# Patient Record
Sex: Male | Born: 1975
Health system: Southern US, Community
[De-identification: ages and names within clinical notes are randomized; demographics above are authoritative.]

## PROBLEM LIST (undated history)

## (undated) DIAGNOSIS — E0500 Thyrotoxicosis with diffuse goiter without thyrotoxic crisis or storm: Secondary | ICD-10-CM

## (undated) DIAGNOSIS — E039 Hypothyroidism, unspecified: Secondary | ICD-10-CM

## (undated) HISTORY — PX: TONSILLECTOMY: SUR1361

## (undated) HISTORY — DX: Hypothyroidism, unspecified: E03.9

## (undated) HISTORY — DX: Thyrotoxicosis with diffuse goiter without thyrotoxic crisis or storm: E05.00

---

## 1998-12-03 ENCOUNTER — Emergency Department (HOSPITAL_COMMUNITY): Admission: EM | Admit: 1998-12-03 | Discharge: 1998-12-03 | Payer: Self-pay | Admitting: Emergency Medicine

## 2010-11-14 ENCOUNTER — Other Ambulatory Visit (HOSPITAL_COMMUNITY): Payer: Self-pay | Admitting: Endocrinology

## 2010-11-14 DIAGNOSIS — E0590 Thyrotoxicosis, unspecified without thyrotoxic crisis or storm: Secondary | ICD-10-CM

## 2010-11-22 ENCOUNTER — Ambulatory Visit (HOSPITAL_COMMUNITY): Payer: Self-pay

## 2010-11-22 ENCOUNTER — Encounter (HOSPITAL_COMMUNITY)
Admission: RE | Admit: 2010-11-22 | Discharge: 2010-11-22 | Disposition: A | Discharge status: Home / Self Care | Payer: Managed Care, Other (non HMO) | Source: Ambulatory Visit | Attending: Endocrinology | Admitting: Endocrinology

## 2010-11-22 DIAGNOSIS — E0590 Thyrotoxicosis, unspecified without thyrotoxic crisis or storm: Secondary | ICD-10-CM | POA: Insufficient documentation

## 2010-11-23 ENCOUNTER — Encounter (HOSPITAL_COMMUNITY): Payer: Self-pay

## 2010-11-23 ENCOUNTER — Encounter (HOSPITAL_COMMUNITY)
Admission: RE | Admit: 2010-11-23 | Discharge: 2010-11-23 | Disposition: A | Discharge status: Home / Self Care | Payer: Managed Care, Other (non HMO) | Source: Ambulatory Visit | Attending: Endocrinology | Admitting: Endocrinology

## 2010-11-23 DIAGNOSIS — E0590 Thyrotoxicosis, unspecified without thyrotoxic crisis or storm: Secondary | ICD-10-CM | POA: Insufficient documentation

## 2010-11-23 MED ORDER — SODIUM IODIDE I 131 CAPSULE
7.9000 | Freq: Once | INTRAVENOUS | Status: AC | PRN
  Administered 2010-11-23: 7.9 via ORAL

## 2010-11-27 ENCOUNTER — Other Ambulatory Visit (HOSPITAL_COMMUNITY): Payer: Self-pay | Admitting: Endocrinology

## 2010-11-27 DIAGNOSIS — E0590 Thyrotoxicosis, unspecified without thyrotoxic crisis or storm: Secondary | ICD-10-CM

## 2010-11-28 ENCOUNTER — Encounter (HOSPITAL_COMMUNITY)
Admission: RE | Admit: 2010-11-28 | Discharge: 2010-11-28 | Disposition: A | Discharge status: Home / Self Care | Payer: Managed Care, Other (non HMO) | Source: Ambulatory Visit | Attending: Endocrinology | Admitting: Endocrinology

## 2010-11-28 DIAGNOSIS — E0500 Thyrotoxicosis with diffuse goiter without thyrotoxic crisis or storm: Secondary | ICD-10-CM | POA: Insufficient documentation

## 2010-11-28 DIAGNOSIS — E0590 Thyrotoxicosis, unspecified without thyrotoxic crisis or storm: Secondary | ICD-10-CM

## 2010-11-28 MED ORDER — SODIUM IODIDE I 131 CAPSULE
20.0000 | Freq: Once | INTRAVENOUS | Status: AC | PRN
  Administered 2010-11-28: 21.3 via ORAL

## 2017-07-30 DIAGNOSIS — E039 Hypothyroidism, unspecified: Secondary | ICD-10-CM | POA: Diagnosis not present

## 2017-07-30 DIAGNOSIS — R030 Elevated blood-pressure reading, without diagnosis of hypertension: Secondary | ICD-10-CM | POA: Diagnosis not present

## 2017-07-30 DIAGNOSIS — R635 Abnormal weight gain: Secondary | ICD-10-CM | POA: Diagnosis not present

## 2017-08-31 DIAGNOSIS — J111 Influenza due to unidentified influenza virus with other respiratory manifestations: Secondary | ICD-10-CM | POA: Diagnosis not present

## 2017-08-31 DIAGNOSIS — Z20828 Contact with and (suspected) exposure to other viral communicable diseases: Secondary | ICD-10-CM | POA: Diagnosis not present

## 2018-03-24 DIAGNOSIS — Z23 Encounter for immunization: Secondary | ICD-10-CM | POA: Diagnosis not present

## 2018-03-24 DIAGNOSIS — E039 Hypothyroidism, unspecified: Secondary | ICD-10-CM | POA: Diagnosis not present

## 2018-08-06 DIAGNOSIS — Z720 Tobacco use: Secondary | ICD-10-CM | POA: Diagnosis not present

## 2018-08-06 DIAGNOSIS — E890 Postprocedural hypothyroidism: Secondary | ICD-10-CM | POA: Diagnosis not present

## 2018-08-12 DIAGNOSIS — F17220 Nicotine dependence, chewing tobacco, uncomplicated: Secondary | ICD-10-CM | POA: Diagnosis not present

## 2018-08-12 DIAGNOSIS — W182XXA Fall in (into) shower or empty bathtub, initial encounter: Secondary | ICD-10-CM | POA: Diagnosis not present

## 2018-08-12 DIAGNOSIS — M545 Low back pain: Secondary | ICD-10-CM | POA: Diagnosis not present

## 2018-08-12 DIAGNOSIS — M5430 Sciatica, unspecified side: Secondary | ICD-10-CM | POA: Diagnosis not present

## 2018-08-12 DIAGNOSIS — M79604 Pain in right leg: Secondary | ICD-10-CM | POA: Diagnosis not present

## 2018-08-12 DIAGNOSIS — Z79899 Other long term (current) drug therapy: Secondary | ICD-10-CM | POA: Diagnosis not present

## 2018-08-12 DIAGNOSIS — E079 Disorder of thyroid, unspecified: Secondary | ICD-10-CM | POA: Diagnosis not present

## 2018-08-12 DIAGNOSIS — M5441 Lumbago with sciatica, right side: Secondary | ICD-10-CM | POA: Diagnosis not present

## 2018-08-14 DIAGNOSIS — M545 Low back pain: Secondary | ICD-10-CM | POA: Diagnosis not present

## 2018-08-14 DIAGNOSIS — W19XXXD Unspecified fall, subsequent encounter: Secondary | ICD-10-CM | POA: Diagnosis not present

## 2018-08-14 DIAGNOSIS — S3992XD Unspecified injury of lower back, subsequent encounter: Secondary | ICD-10-CM | POA: Diagnosis not present

## 2018-08-20 DIAGNOSIS — M7918 Myalgia, other site: Secondary | ICD-10-CM | POA: Diagnosis not present

## 2018-08-20 DIAGNOSIS — M47816 Spondylosis without myelopathy or radiculopathy, lumbar region: Secondary | ICD-10-CM | POA: Diagnosis not present

## 2019-01-08 DIAGNOSIS — Z13220 Encounter for screening for lipoid disorders: Secondary | ICD-10-CM | POA: Diagnosis not present

## 2019-01-08 DIAGNOSIS — Z131 Encounter for screening for diabetes mellitus: Secondary | ICD-10-CM | POA: Diagnosis not present

## 2019-01-08 DIAGNOSIS — E890 Postprocedural hypothyroidism: Secondary | ICD-10-CM | POA: Diagnosis not present

## 2019-01-08 DIAGNOSIS — Z125 Encounter for screening for malignant neoplasm of prostate: Secondary | ICD-10-CM | POA: Diagnosis not present

## 2019-01-08 DIAGNOSIS — Z87891 Personal history of nicotine dependence: Secondary | ICD-10-CM | POA: Diagnosis not present

## 2019-01-08 DIAGNOSIS — Z0000 Encounter for general adult medical examination without abnormal findings: Secondary | ICD-10-CM | POA: Diagnosis not present

## 2019-01-20 ENCOUNTER — Other Ambulatory Visit: Payer: Self-pay | Admitting: Physician Assistant

## 2019-01-20 DIAGNOSIS — M25519 Pain in unspecified shoulder: Secondary | ICD-10-CM

## 2019-02-04 ENCOUNTER — Ambulatory Visit
Admission: RE | Admit: 2019-02-04 | Discharge: 2019-02-04 | Disposition: A | Discharge status: Home / Self Care | Payer: Managed Care, Other (non HMO) | Source: Ambulatory Visit | Attending: Physician Assistant | Admitting: Physician Assistant

## 2019-02-04 ENCOUNTER — Other Ambulatory Visit: Payer: Self-pay | Admitting: Internal Medicine

## 2019-02-04 DIAGNOSIS — M25519 Pain in unspecified shoulder: Secondary | ICD-10-CM

## 2019-02-04 DIAGNOSIS — M67813 Other specified disorders of tendon, right shoulder: Secondary | ICD-10-CM | POA: Diagnosis not present

## 2019-06-03 DIAGNOSIS — Z20828 Contact with and (suspected) exposure to other viral communicable diseases: Secondary | ICD-10-CM | POA: Diagnosis not present

## 2019-10-02 DIAGNOSIS — E890 Postprocedural hypothyroidism: Secondary | ICD-10-CM | POA: Diagnosis not present

## 2020-02-02 DIAGNOSIS — E890 Postprocedural hypothyroidism: Secondary | ICD-10-CM | POA: Diagnosis not present

## 2020-05-09 DIAGNOSIS — Z8042 Family history of malignant neoplasm of prostate: Secondary | ICD-10-CM | POA: Diagnosis not present

## 2020-05-09 DIAGNOSIS — R319 Hematuria, unspecified: Secondary | ICD-10-CM | POA: Diagnosis not present

## 2020-06-08 DIAGNOSIS — U071 COVID-19: Secondary | ICD-10-CM

## 2020-06-08 DIAGNOSIS — J1282 Pneumonia due to coronavirus disease 2019: Secondary | ICD-10-CM

## 2020-06-08 HISTORY — DX: Pneumonia due to coronavirus disease 2019: J12.82

## 2020-06-08 HISTORY — DX: COVID-19: U07.1

## 2020-08-05 DIAGNOSIS — U071 COVID-19: Secondary | ICD-10-CM | POA: Diagnosis not present

## 2020-08-05 DIAGNOSIS — J40 Bronchitis, not specified as acute or chronic: Secondary | ICD-10-CM | POA: Diagnosis not present

## 2020-08-29 DIAGNOSIS — J189 Pneumonia, unspecified organism: Secondary | ICD-10-CM | POA: Diagnosis not present

## 2020-09-26 DIAGNOSIS — B948 Sequelae of other specified infectious and parasitic diseases: Secondary | ICD-10-CM | POA: Diagnosis not present

## 2020-09-26 DIAGNOSIS — J069 Acute upper respiratory infection, unspecified: Secondary | ICD-10-CM | POA: Diagnosis not present

## 2020-11-08 ENCOUNTER — Ambulatory Visit: Payer: BC Managed Care – PPO | Admitting: Pulmonary Disease

## 2020-11-08 ENCOUNTER — Ambulatory Visit (INDEPENDENT_AMBULATORY_CARE_PROVIDER_SITE_OTHER): Payer: BC Managed Care – PPO

## 2020-11-08 ENCOUNTER — Other Ambulatory Visit: Payer: Self-pay

## 2020-11-08 ENCOUNTER — Encounter: Payer: Self-pay | Admitting: Pulmonary Disease

## 2020-11-08 VITALS — BP 110/70 | HR 99 | Temp 98.2°F | Ht 72.0 in | Wt 182.8 lb

## 2020-11-08 DIAGNOSIS — R053 Chronic cough: Secondary | ICD-10-CM

## 2020-11-08 DIAGNOSIS — R059 Cough, unspecified: Secondary | ICD-10-CM | POA: Diagnosis not present

## 2020-11-08 LAB — COMPREHENSIVE METABOLIC PANEL
ALT: 14 U/L (ref 0–53)
AST: 20 U/L (ref 0–37)
Albumin: 4.6 g/dL (ref 3.5–5.2)
Alkaline Phosphatase: 80 U/L (ref 39–117)
BUN: 21 mg/dL (ref 6–23)
CO2: 28 mEq/L (ref 19–32)
Calcium: 10.1 mg/dL (ref 8.4–10.5)
Chloride: 104 mEq/L (ref 96–112)
Creatinine, Ser: 1.15 mg/dL (ref 0.40–1.50)
GFR: 76.17 mL/min (ref >60.00)
Glucose, Bld: 74 mg/dL (ref 70–99)
Potassium: 4 mEq/L (ref 3.5–5.1)
Sodium: 139 mEq/L (ref 135–145)
Total Bilirubin: 0.9 mg/dL (ref 0.2–1.2)
Total Protein: 7.2 g/dL (ref 6.0–8.3)

## 2020-11-08 LAB — CBC WITH DIFFERENTIAL/PLATELET
Basophils Absolute: 0.1 10*3/uL (ref 0.0–0.1)
Basophils Relative: 2.5 % (ref 0.0–3.0)
Eosinophils Absolute: 1 10*3/uL — ABNORMAL HIGH (ref 0.0–0.7)
Eosinophils Relative: 17.8 % — ABNORMAL HIGH (ref 0.0–5.0)
HCT: 43.3 % (ref 39.0–52.0)
Hemoglobin: 14.4 g/dL (ref 13.0–17.0)
Lymphocytes Relative: 37.6 % (ref 12.0–46.0)
Lymphs Abs: 2.2 10*3/uL (ref 0.7–4.0)
MCHC: 33.3 g/dL (ref 30.0–36.0)
MCV: 93.4 fl (ref 78.0–100.0)
Monocytes Absolute: 0.6 10*3/uL (ref 0.1–1.0)
Monocytes Relative: 10.7 % (ref 3.0–12.0)
Neutro Abs: 1.8 10*3/uL (ref 1.4–7.7)
Neutrophils Relative %: 31.4 % — ABNORMAL LOW (ref 43.0–77.0)
Platelets: 316 10*3/uL (ref 150.0–400.0)
RBC: 4.63 Mil/uL (ref 4.22–5.81)
RDW: 13.1 % (ref 11.5–15.5)
WBC: 5.8 10*3/uL (ref 4.0–10.5)

## 2020-11-08 MED ORDER — FLOVENT HFA 110 MCG/ACT IN AERO: 2.0000 | INHALATION_SPRAY | Freq: Two times a day (BID) | RESPIRATORY_TRACT | 12 refills | Status: AC

## 2020-11-08 NOTE — Patient Instructions (Signed)
Chest xray and lab tests today  Flovent two puffs in the morning and two puffs in the evening, and rinse your mouth after each use  Albuterol two puffs every 6 hours as needed for cough, wheeze, or chest congestion  Will schedule pulmonary function test   Follow up in 4 weeks with Dr. Craige Cotta or Nurse Practitioner

## 2020-11-08 NOTE — Progress Notes (Signed)
[facility]  Chief Complaint  Patient presents with  . Consult    Non productive cough, wheezing at times since having covid back in December 2021    Constitutional:  BP 110/70 (BP Location: Right Arm, Cuff Size: Normal)   Pulse 99   Temp 98.2 F (36.8 C) (Temporal)   Ht 6' (1.829 m)   Wt 182 lb 12.8 oz (82.9 kg)   SpO2 98% Comment: Room air  BMI 24.79 kg/m   Past Medical History:  Hypothyroidism, COVID 19 pneumonia December 2021  Past Surgical History:  He  has a past surgical history that includes Tonsillectomy.  Brief Summary:  Lawrence Koch is a 45 y.o. male former smoker with chronic cough after having COVID 19 pneumonia in December 2021.       Subjective:   He didn't have any respiratory issues prior to getting COVID 19 pneumonia in December 2021.  He didn't seek an specific treatment then.  He was having pain in his chest and his right hip.  He developed a persist cough.  He had more chest pain in February and went to urgent care.  He was told he had double pneumonia.  He was treated with antibiotics, prednisone, cough medicine and albuterol.  These therapies helped some.  He still uses albuterol.  He has not had repeat xray since February.  He hears himself wheezing at times.  His cough is usually dry.  He get right hip pain when standing, walking, or coughing.  He had a rash on his face a couple of weeks ago that went away after he used benadryl.  No prior history of asthma or allergies.  He quit smoking several years ago.  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  He has pet dog.  He does snore at night.    Physical Exam:   Appearance - well kempt   ENMT - no sinus tenderness, no oral exudate, no LAN, Mallampati 4 airway, no stridor, narrow jaw, retrognathic  Respiratory - equal breath sounds bilaterally, faint wheezing in Rt upper chest anteriorly  CV - s1s2 regular rate and rhythm, no  murmurs  Ext - no clubbing, no edema  Skin - no rashes  Psych - normal mood and affect   Pulmonary testing:    Chest Imaging:    Sleep Tests:    Social History:  He  reports that he quit smoking about a year ago. His smoking use included cigarettes. He has a 25.00 pack-year smoking history. His smokeless tobacco use includes chew.  Family History:  His family history includes COPD in his paternal grandfather; Hypothyroidism in his mother; Prostate cancer in his father.    Discussion:  He has persistent cough since having COVID 19 pneumonia and then community acquired pneumonia.  I am concerned he could now have asthma.  It is also possible he could have developed interstitial lung disease related to prior COVID infection.  He also has some snoring and might have sleep apnea.  Assessment/Plan:   Chronic cough with concern for asthma or ILD. - will have him try flovent 100 mcg two puffs bid and continue albuterol prn - will arrange for CMET, CBC with diff, Chest Xray, and PFT  Snoring. - might need assessment for sleep apnea; reassess at next visit  Right hip pain. - advised him to follow up with his PCP  Time Spent Involved in Patient Care on Day of Examination:  19  minutes  Follow up:  Patient Instructions  Chest xray and lab tests today  Flovent two puffs in the morning and two puffs in the evening, and rinse your mouth after each use  Albuterol two puffs every 6 hours as needed for cough, wheeze, or chest congestion  Will schedule pulmonary function test   Follow up in 4 weeks with Dr. Craige Cotta or Nurse Practitioner   Medication List:   Allergies as of 11/08/2020   No Known Allergies     Medication List       Accurate as of Nov 08, 2020 11:27 AM. If you have any questions, ask your nurse or doctor.        albuterol 108 (90 Base) MCG/ACT inhaler Commonly known as: VENTOLIN HFA 1-2 puffs every 6 (six) hours as needed.   cyclobenzaprine 10 MG  tablet Commonly known as: FLEXERIL Take 1 tablet by mouth 3 (three) times daily as needed.   Flovent HFA 110 MCG/ACT inhaler Generic drug: fluticasone Inhale 2 puffs into the lungs in the morning and at bedtime. Started by: Coralyn Helling, MD   levothyroxine 137 MCG tablet Commonly known as: SYNTHROID Take 137 mcg by mouth every morning.       Signature:  Coralyn Helling, MD [facility] Pulmonary/Critical Care Pager - [phone] 11/08/2020, 11:27 AM

## 2020-11-09 NOTE — Progress Notes (Signed)
Called and went over lab result per Dr Craige Cotta with patient. All questions answered and patient expressed full understanding of results and Dr Evlyn Courier recommendations. Nothing further needed at this time.

## 2020-11-14 ENCOUNTER — Encounter: Payer: Self-pay | Admitting: *Deleted

## 2020-12-06 ENCOUNTER — Other Ambulatory Visit (HOSPITAL_COMMUNITY)
Admission: RE | Admit: 2020-12-06 | Discharge: 2020-12-06 | Disposition: A | Discharge status: Home / Self Care | Payer: BC Managed Care – PPO | Source: Ambulatory Visit | Attending: Pulmonary Disease | Admitting: Pulmonary Disease

## 2020-12-06 DIAGNOSIS — Z01812 Encounter for preprocedural laboratory examination: Secondary | ICD-10-CM | POA: Diagnosis not present

## 2020-12-06 DIAGNOSIS — Z20822 Contact with and (suspected) exposure to covid-19: Secondary | ICD-10-CM | POA: Insufficient documentation

## 2020-12-06 LAB — SARS CORONAVIRUS 2 (TAT 6-24 HRS): SARS Coronavirus 2: NEGATIVE

## 2020-12-09 ENCOUNTER — Ambulatory Visit: Payer: BC Managed Care – PPO | Admitting: Pulmonary Disease

## 2021-01-16 ENCOUNTER — Other Ambulatory Visit (HOSPITAL_COMMUNITY): Payer: BC Managed Care – PPO

## 2021-01-16 DIAGNOSIS — M545 Low back pain, unspecified: Secondary | ICD-10-CM | POA: Diagnosis not present

## 2021-01-18 ENCOUNTER — Ambulatory Visit: Payer: BC Managed Care – PPO | Admitting: Pulmonary Disease

## 2021-01-20 ENCOUNTER — Emergency Department (HOSPITAL_COMMUNITY)
Admission: EM | Admit: 2021-01-20 | Discharge: 2021-01-20 | Disposition: A | Discharge status: Home / Self Care | Payer: BC Managed Care – PPO | Attending: Emergency Medicine | Admitting: Emergency Medicine

## 2021-01-20 ENCOUNTER — Other Ambulatory Visit: Payer: Self-pay

## 2021-01-20 ENCOUNTER — Encounter (HOSPITAL_COMMUNITY): Payer: Self-pay | Admitting: Emergency Medicine

## 2021-01-20 ENCOUNTER — Emergency Department (HOSPITAL_COMMUNITY): Payer: BC Managed Care – PPO

## 2021-01-20 DIAGNOSIS — M545 Low back pain, unspecified: Secondary | ICD-10-CM | POA: Insufficient documentation

## 2021-01-20 DIAGNOSIS — M79604 Pain in right leg: Secondary | ICD-10-CM | POA: Insufficient documentation

## 2021-01-20 DIAGNOSIS — Z79899 Other long term (current) drug therapy: Secondary | ICD-10-CM | POA: Diagnosis not present

## 2021-01-20 DIAGNOSIS — M25551 Pain in right hip: Secondary | ICD-10-CM | POA: Diagnosis not present

## 2021-01-20 DIAGNOSIS — R531 Weakness: Secondary | ICD-10-CM | POA: Diagnosis not present

## 2021-01-20 DIAGNOSIS — E039 Hypothyroidism, unspecified: Secondary | ICD-10-CM | POA: Diagnosis not present

## 2021-01-20 DIAGNOSIS — M5440 Lumbago with sciatica, unspecified side: Secondary | ICD-10-CM

## 2021-01-20 DIAGNOSIS — F17220 Nicotine dependence, chewing tobacco, uncomplicated: Secondary | ICD-10-CM | POA: Diagnosis not present

## 2021-01-20 DIAGNOSIS — Z8616 Personal history of COVID-19: Secondary | ICD-10-CM | POA: Insufficient documentation

## 2021-01-20 DIAGNOSIS — R200 Anesthesia of skin: Secondary | ICD-10-CM | POA: Insufficient documentation

## 2021-01-20 LAB — BASIC METABOLIC PANEL
Anion gap: 7 (ref 5–15)
BUN: 23 mg/dL — ABNORMAL HIGH (ref 6–20)
CO2: 32 mmol/L (ref 22–32)
Calcium: 10 mg/dL (ref 8.9–10.3)
Chloride: 100 mmol/L (ref 98–111)
Creatinine, Ser: 1.05 mg/dL (ref 0.61–1.24)
GFR, Estimated: >60 mL/min (ref >60)
Glucose, Bld: 72 mg/dL (ref 70–99)
Potassium: 3.2 mmol/L — ABNORMAL LOW (ref 3.5–5.1)
Sodium: 139 mmol/L (ref 135–145)

## 2021-01-20 LAB — CBC WITH DIFFERENTIAL/PLATELET
Abs Immature Granulocytes: 0.03 10*3/uL (ref 0.00–0.07)
Basophils Absolute: 0.1 10*3/uL (ref 0.0–0.1)
Basophils Relative: 1 %
Eosinophils Absolute: 0.1 10*3/uL (ref 0.0–0.5)
Eosinophils Relative: 1 %
HCT: 46 % (ref 39.0–52.0)
Hemoglobin: 15.4 g/dL (ref 13.0–17.0)
Immature Granulocytes: 0 %
Lymphocytes Relative: 27 %
Lymphs Abs: 2.6 10*3/uL (ref 0.7–4.0)
MCH: 31 pg (ref 26.0–34.0)
MCHC: 33.5 g/dL (ref 30.0–36.0)
MCV: 92.7 fL (ref 80.0–100.0)
Monocytes Absolute: 1 10*3/uL (ref 0.1–1.0)
Monocytes Relative: 10 %
Neutro Abs: 6 10*3/uL (ref 1.7–7.7)
Neutrophils Relative %: 61 %
Platelets: 324 10*3/uL (ref 150–400)
RBC: 4.96 MIL/uL (ref 4.22–5.81)
RDW: 12 % (ref 11.5–15.5)
WBC: 9.8 10*3/uL (ref 4.0–10.5)
nRBC: 0 % (ref 0.0–0.2)

## 2021-01-20 MED ORDER — NAPROXEN 500 MG PO TABS: 500.0000 mg | ORAL_TABLET | Freq: Two times a day (BID) | ORAL | 0 refills | Status: AC

## 2021-01-20 MED ORDER — LORAZEPAM 1 MG PO TABS
0.5000 mg | ORAL_TABLET | Freq: Once | ORAL | Status: AC
  Administered 2021-01-20: 0.5 mg via ORAL
  Filled 2021-01-20: qty 1

## 2021-01-20 MED ORDER — SODIUM CHLORIDE 0.9 % IV BOLUS
500.0000 mL | Freq: Once | INTRAVENOUS | Status: AC
  Administered 2021-01-20: 500 mL via INTRAVENOUS

## 2021-01-20 MED ORDER — MORPHINE SULFATE (PF) 4 MG/ML IV SOLN
4.0000 mg | Freq: Once | INTRAVENOUS | Status: AC
  Administered 2021-01-20: 4 mg via INTRAVENOUS
  Filled 2021-01-20: qty 1

## 2021-01-20 MED ORDER — GADOBUTROL 1 MMOL/ML IV SOLN
8.0000 mL | Freq: Once | INTRAVENOUS | Status: AC | PRN
  Administered 2021-01-20: 8 mL via INTRAVENOUS

## 2021-01-20 NOTE — Discharge Instructions (Addendum)
Call your primary care doctor or specialist as discussed in the next 2-3 days.   Return immediately back to the ER if:  Your symptoms worsen within the next 12-24 hours. You develop new symptoms such as new fevers, persistent vomiting, new pain, shortness of breath, or new weakness or numbness, or if you have any other concerns.  

## 2021-01-20 NOTE — ED Provider Notes (Signed)
[facility] EMERGENCY DEPARTMENT Provider Note   CSN: [id number] Arrival date & time: 01/20/21  1016     History Chief Complaint  Patient presents with   Back Pain    Lawrence Koch is a 45 y.o. male.  HPI  45 year old male presents the emergency department with concern for lower back pain.  Patient states this started about a week ago.  Primarily was in the lower back, now he is having shooting pain from his right hip down his right leg with numbness of the lateral leg wrapping into his groin.  States that the pain gets worse with weightbearing and ambulating.  He was seen as an outpatient and treated for sciatica with pain meds, muscle relaxer and steroids without any relief.  Patient was evaluated by Ortho earlier today and there was mention of an abnormal finding on L5 on the x-ray with concern for possible cauda equina syndrome so he was referred to the ER for further evaluation.  Patient continues to pain of the shooting leg down the right side with numbness primarily in the lower leg and groin area, no foot drop.  No leg discoloration, no back injury or trauma.  Patient feels as if his urine has been darker than normal with some slight hesitation but he denies any retention or incontinence.  Past Medical History:  Diagnosis Date   Graves disease    Hypothyroidism    Pneumonia due to COVID-19 virus 06/2020    There are no problems to display for this patient.   Past Surgical History:  Procedure Laterality Date   TONSILLECTOMY         Family History  Problem Relation Age of Onset   COPD Paternal Grandfather    Hypothyroidism Mother    Prostate cancer Father     Social History   Tobacco Use   Smoking status: Former    Packs/day: 1.00    Years: 25.00    Pack years: 25.00    Types: Cigarettes    Quit date: 11/2019    Years since quitting: 1.2   Smokeless tobacco: Current    Types: Chew  Vaping Use   Vaping Use: Never used    Home  Medications Prior to Admission medications   Medication Sig Start Date End Date Taking? Authorizing Provider  albuterol (VENTOLIN HFA) 108 (90 Base) MCG/ACT inhaler 1-2 puffs every 6 (six) hours as needed. 08/29/20   [provider]  cyclobenzaprine (FLEXERIL) 10 MG tablet Take 1 tablet by mouth 3 (three) times daily as needed. 09/30/20   [provider]  fluticasone (FLOVENT HFA) 110 MCG/ACT inhaler Inhale 2 puffs into the lungs in the morning and at bedtime. 11/08/20   Coralyn Helling, MD  levothyroxine (SYNTHROID) 137 MCG tablet Take 137 mcg by mouth every morning. 09/01/20   [provider]    Allergies    Patient has no known allergies.  Review of Systems   Review of Systems  Constitutional:  Negative for chills and fever.  HENT:  Negative for congestion.   Eyes:  Negative for visual disturbance.  Respiratory:  Negative for shortness of breath.   Cardiovascular:  Negative for chest pain.  Gastrointestinal:  Negative for abdominal pain, diarrhea and vomiting.  Genitourinary:  Negative for dysuria.       Dark urine with hesitation  Musculoskeletal:  Positive for back pain.       + Right lower extremity pain with numbness  Skin:  Negative for rash.  Neurological:  Negative for headaches.   Physical Exam Updated Vital Signs BP (!) 161/104   Pulse 79   Temp 98.1 F (36.7 C)   Resp 16   Ht 6' (1.829 m)   Wt 83.9 kg   SpO2 98%   BMI 25.09 kg/m   Physical Exam Vitals and nursing note reviewed.  Constitutional:      Appearance: Normal appearance.  HENT:     Head: Normocephalic.     Mouth/Throat:     Mouth: Mucous membranes are moist.  Cardiovascular:     Rate and Rhythm: Normal rate.  Pulmonary:     Effort: Pulmonary effort is normal. No respiratory distress.  Abdominal:     Palpations: Abdomen is soft.     Tenderness: There is no abdominal tenderness.  Genitourinary:    Comments: Rectal tone exam deferred at this time due to the patient being in  the hallway Musculoskeletal:        General: No deformity.     Cervical back: No tenderness.     Comments: Mild weakness of the right lower extremity with hip flexion and knee extension which appears to be secondary to pain, equal plantar flexion, palpable DP pulses, objective numbness  Skin:    General: Skin is warm.  Neurological:     Mental Status: He is alert and oriented to person, place, and time. Mental status is at baseline.  Psychiatric:        Mood and Affect: Mood normal.    ED Results / Procedures / Treatments   Labs (all labs ordered are listed, but only abnormal results are displayed) Labs Reviewed  URINALYSIS, ROUTINE W REFLEX MICROSCOPIC  CBC WITH DIFFERENTIAL/PLATELET  BASIC METABOLIC PANEL    EKG None  Radiology No results found.  Procedures Procedures   Medications Ordered in ED Medications  morphine 4 MG/ML injection 4 mg (4 mg Intravenous Given 01/20/21 1223)  sodium chloride 0.9 % bolus 500 mL (500 mLs Intravenous New Bag/Given 01/20/21 1223)  LORazepam (ATIVAN) tablet 0.5 mg (0.5 mg Oral Given 01/20/21 1229)    ED Course  I have reviewed the triage vital signs and the nursing notes.  Pertinent labs & imaging results that were available during my care of the patient were reviewed by me and considered in my medical decision making (see chart for details).    MDM Rules/Calculators/A&P                          45 year old male presents the emergency department with lower back and right leg pain, weakness and numbness that extends into his groin.  Has been treated as an outpatient for sciatica without any relief, was seen by orthopedics and referred here for rule out cauda equina with an MRI.  Patient does have weakness on exam, possibly secondary to pain with subjective numbness on the lateral right leg and groin.  Plan for MRI for rule out cauda equina. Patient signed out to oncoming provider pending MRI and re evaluation.  Final Clinical  Impression(s) / ED Diagnoses Final diagnoses:  None    Rx / DC Orders ED Discharge Orders     None        Rozelle Logan, DO 01/20/21 1503

## 2021-01-20 NOTE — ED Notes (Signed)
Patient transported to MRI 

## 2021-01-20 NOTE — ED Provider Notes (Signed)
Shows no evidence of cauda equina.  Mild to moderate disc protrusion noted at L5-S1 and L4-L5.  Case discussed orthopedics.  Recommending outpatient follow-up with his doctors within the week.  Recommending return for worsening symptoms or any additional concerns.    Cheryll Cockayne, MD 01/20/21 [phone]

## 2021-01-20 NOTE — ED Triage Notes (Signed)
Pt reports lower back pain that started Sunday, went to doctor on Monday, was diagnosed with sciatica and reports pain has not gotten better after prescription medications. Pt reports shooting pain from rt hip down rt leg with numbness.  Pt reports pain gets worse with bearing weight and ambulating, relief with laying on left hip. Denies any recent injuries. Pt reports oliguria, dark urine, denies any blood. Pt reports he had kidney stones in the past but reports this feels different.

## 2021-01-31 DIAGNOSIS — M5416 Radiculopathy, lumbar region: Secondary | ICD-10-CM | POA: Diagnosis not present

## 2021-02-22 ENCOUNTER — Ambulatory Visit: Payer: BC Managed Care – PPO | Admitting: Pulmonary Disease

## 2021-02-22 DIAGNOSIS — M5416 Radiculopathy, lumbar region: Secondary | ICD-10-CM | POA: Diagnosis not present

## 2021-02-28 DIAGNOSIS — M5416 Radiculopathy, lumbar region: Secondary | ICD-10-CM | POA: Diagnosis not present

## 2021-03-15 DIAGNOSIS — M5416 Radiculopathy, lumbar region: Secondary | ICD-10-CM | POA: Diagnosis not present

## 2021-03-20 DIAGNOSIS — M5416 Radiculopathy, lumbar region: Secondary | ICD-10-CM | POA: Diagnosis not present

## 2021-03-29 DIAGNOSIS — R269 Unspecified abnormalities of gait and mobility: Secondary | ICD-10-CM | POA: Diagnosis not present

## 2021-03-29 DIAGNOSIS — R531 Weakness: Secondary | ICD-10-CM | POA: Diagnosis not present

## 2021-03-29 DIAGNOSIS — M5416 Radiculopathy, lumbar region: Secondary | ICD-10-CM | POA: Diagnosis not present

## 2021-04-14 DIAGNOSIS — R531 Weakness: Secondary | ICD-10-CM | POA: Diagnosis not present

## 2021-04-14 DIAGNOSIS — R269 Unspecified abnormalities of gait and mobility: Secondary | ICD-10-CM | POA: Diagnosis not present

## 2021-04-14 DIAGNOSIS — M5416 Radiculopathy, lumbar region: Secondary | ICD-10-CM | POA: Diagnosis not present

## 2021-04-18 ENCOUNTER — Ambulatory Visit: Payer: BC Managed Care – PPO | Admitting: Pulmonary Disease

## 2021-04-21 DIAGNOSIS — R531 Weakness: Secondary | ICD-10-CM | POA: Diagnosis not present

## 2021-04-21 DIAGNOSIS — R269 Unspecified abnormalities of gait and mobility: Secondary | ICD-10-CM | POA: Diagnosis not present

## 2021-04-21 DIAGNOSIS — M5416 Radiculopathy, lumbar region: Secondary | ICD-10-CM | POA: Diagnosis not present

## 2021-05-05 DIAGNOSIS — R269 Unspecified abnormalities of gait and mobility: Secondary | ICD-10-CM | POA: Diagnosis not present

## 2021-05-05 DIAGNOSIS — M5416 Radiculopathy, lumbar region: Secondary | ICD-10-CM | POA: Diagnosis not present

## 2021-05-05 DIAGNOSIS — R531 Weakness: Secondary | ICD-10-CM | POA: Diagnosis not present

## 2021-09-19 DIAGNOSIS — F439 Reaction to severe stress, unspecified: Secondary | ICD-10-CM | POA: Diagnosis not present

## 2021-09-19 DIAGNOSIS — B948 Sequelae of other specified infectious and parasitic diseases: Secondary | ICD-10-CM | POA: Diagnosis not present

## 2021-09-19 DIAGNOSIS — Z0000 Encounter for general adult medical examination without abnormal findings: Secondary | ICD-10-CM | POA: Diagnosis not present

## 2021-09-19 DIAGNOSIS — Z13220 Encounter for screening for lipoid disorders: Secondary | ICD-10-CM | POA: Diagnosis not present

## 2021-09-19 DIAGNOSIS — Z125 Encounter for screening for malignant neoplasm of prostate: Secondary | ICD-10-CM | POA: Diagnosis not present

## 2021-09-19 DIAGNOSIS — Z23 Encounter for immunization: Secondary | ICD-10-CM | POA: Diagnosis not present

## 2021-09-19 DIAGNOSIS — E890 Postprocedural hypothyroidism: Secondary | ICD-10-CM | POA: Diagnosis not present

## 2021-11-20 DIAGNOSIS — E782 Mixed hyperlipidemia: Secondary | ICD-10-CM | POA: Diagnosis not present

## 2021-11-20 DIAGNOSIS — E890 Postprocedural hypothyroidism: Secondary | ICD-10-CM | POA: Diagnosis not present

## 2021-11-20 DIAGNOSIS — F439 Reaction to severe stress, unspecified: Secondary | ICD-10-CM | POA: Diagnosis not present

## 2022-05-24 DIAGNOSIS — E782 Mixed hyperlipidemia: Secondary | ICD-10-CM | POA: Diagnosis not present

## 2022-05-24 DIAGNOSIS — E890 Postprocedural hypothyroidism: Secondary | ICD-10-CM | POA: Diagnosis not present

## 2022-05-24 DIAGNOSIS — F439 Reaction to severe stress, unspecified: Secondary | ICD-10-CM | POA: Diagnosis not present

## 2022-06-14 DIAGNOSIS — J069 Acute upper respiratory infection, unspecified: Secondary | ICD-10-CM | POA: Diagnosis not present

## 2023-06-03 IMAGING — MR MR LUMBAR SPINE WO/W CM
4 of 7 series · 24 of 48 positions shown · IV contrast (gadavist)
Comparison: None.

CLINICAL DATA: Low back pain.  Rule out cauda equina syndrome.

EXAM:
MRI LUMBAR SPINE WITHOUT AND WITH CONTRAST
TECHNIQUE: Multiplanar and multiecho pulse sequences of the lumbar spine were
obtained without and with intravenous contrast.
CONTRAST:  8mL GADAVIST GADOBUTROL 1 MMOL/ML IV SOLN

[Series 5: T2 · sagittal · 4.0mm · 0.73mm/px · 5 of 15 slices shown (1 of 2)]
[im 1/15]
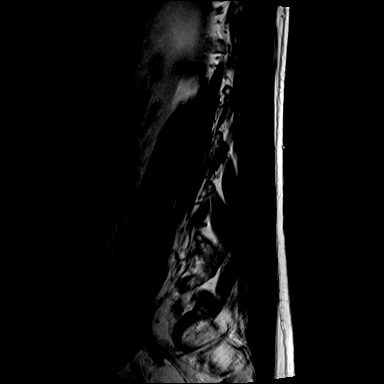
[im 4/15]
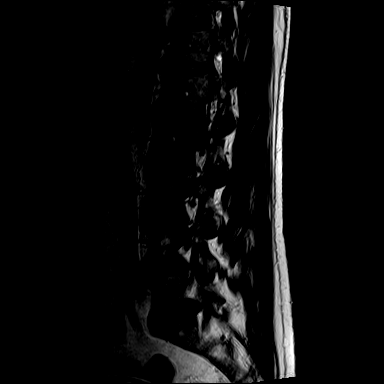
[im 8/15]
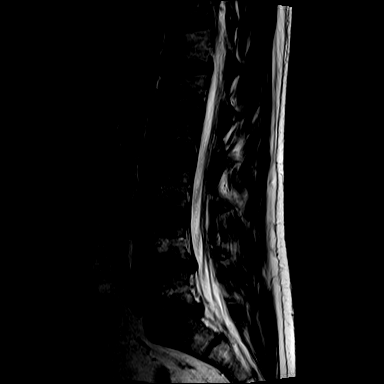
[im 11/15]
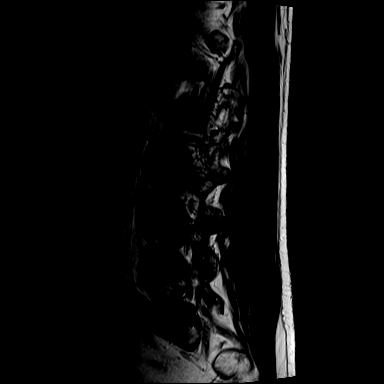
[im 15/15]
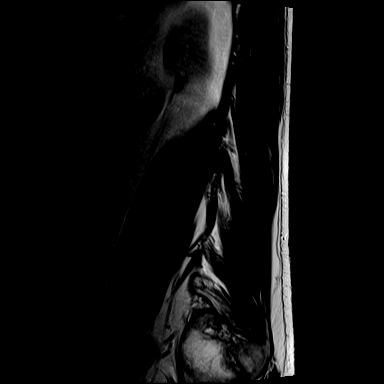

[Series 7: T1 · sagittal · 4.0mm · 0.88mm/px · 4 of 15 slices shown (1 of 2)]
[im 1/15]
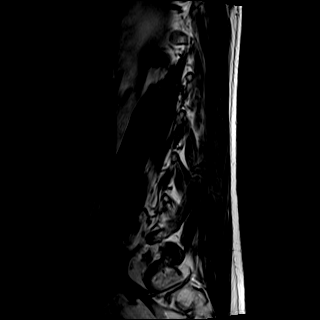
[im 5/15]
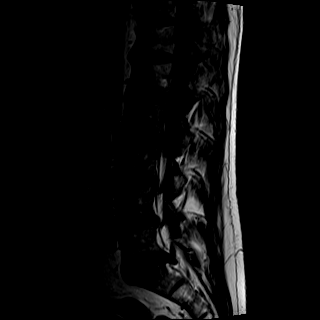
[im 10/15]
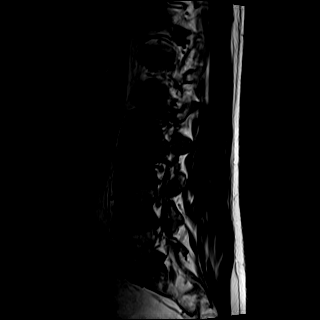
[im 15/15]
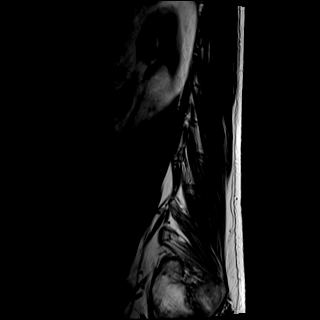

[Series 8: T2 · axial · 4.0mm · 0.57mm/px · z∈[-199,+25]mm · 8 of 38 slices shown (2 of 2)]
[im 1/38]
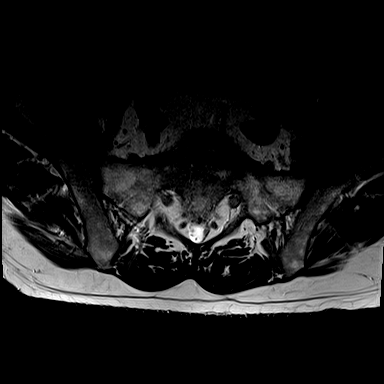
[im 5/38]
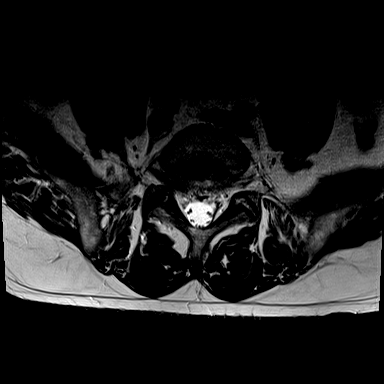
[im 13/38]
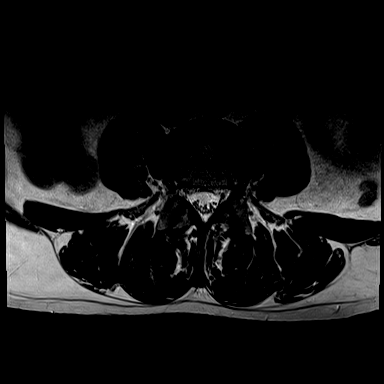
[im 17/38]
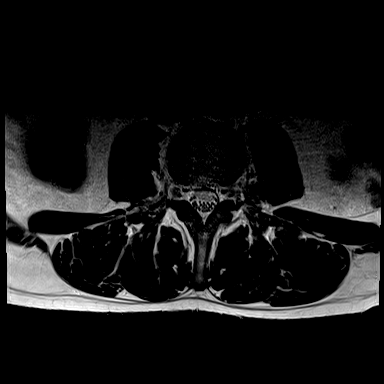
[im 21/38]
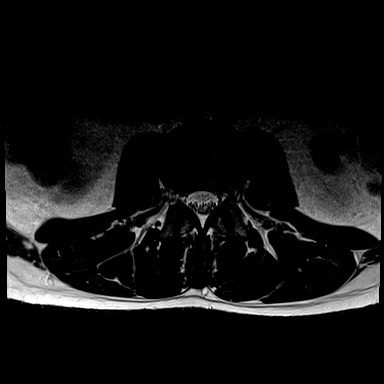
[im 25/38]
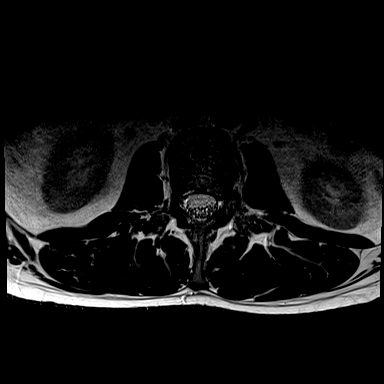
[im 33/38]
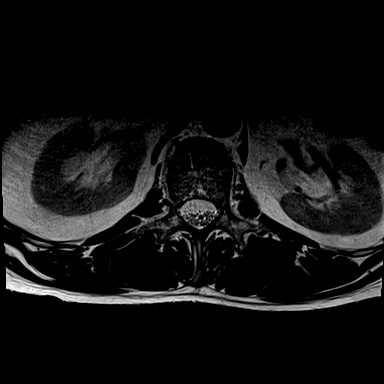
[im 38/38]
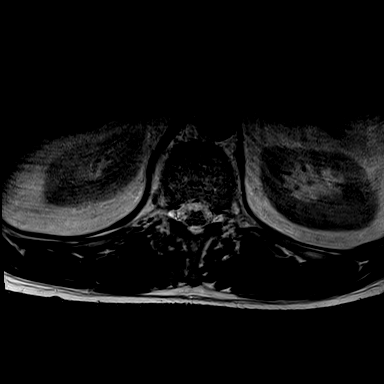

[Series 9: T1 · axial · 4.0mm · 0.34mm/px · z∈[-199,+0]mm · 7 of 38 slices shown (2 of 2)]
[im 1/38]
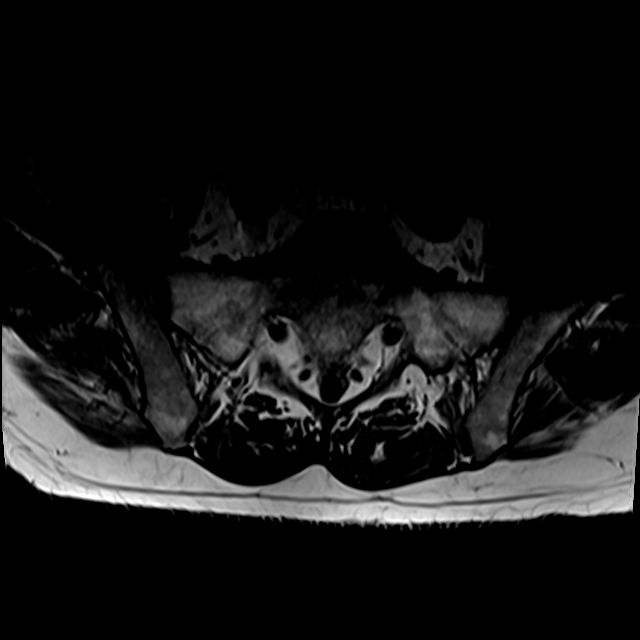
[im 5/38]
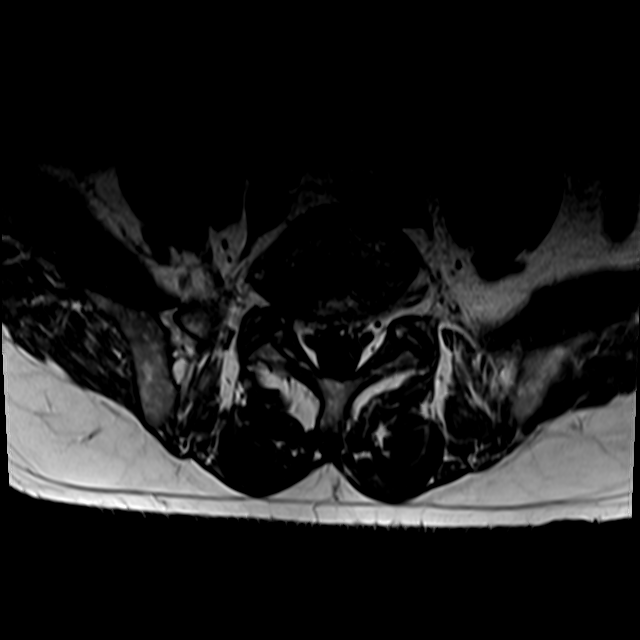
[im 13/38]
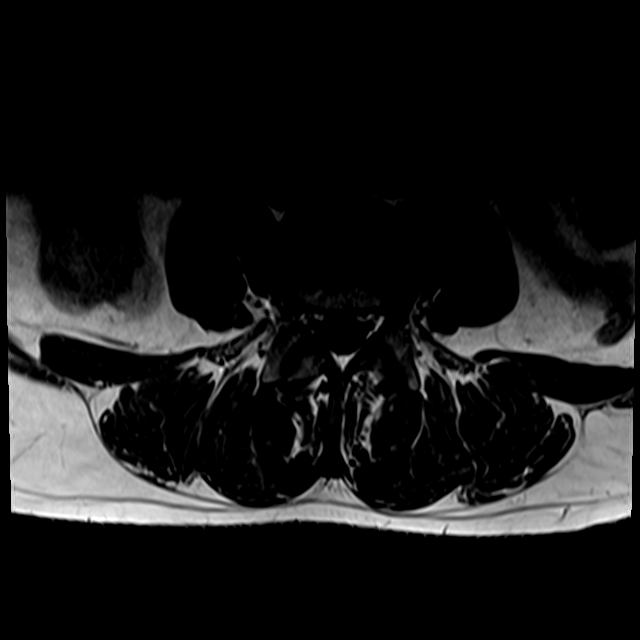
[im 17/38]
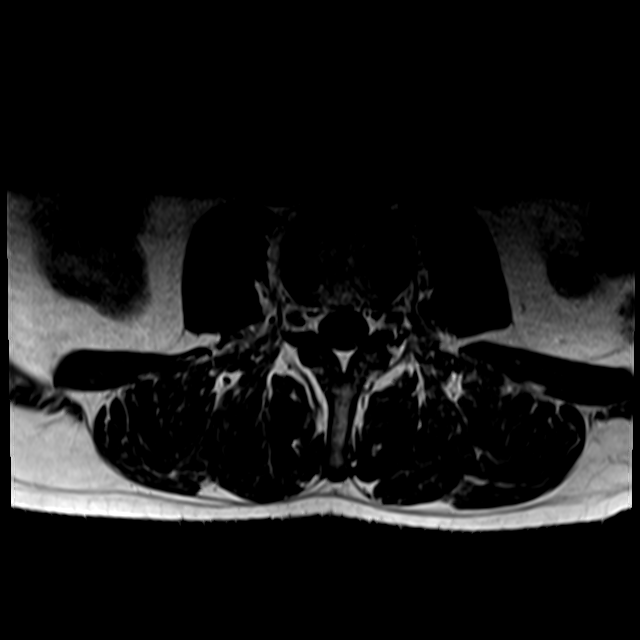
[im 21/38]
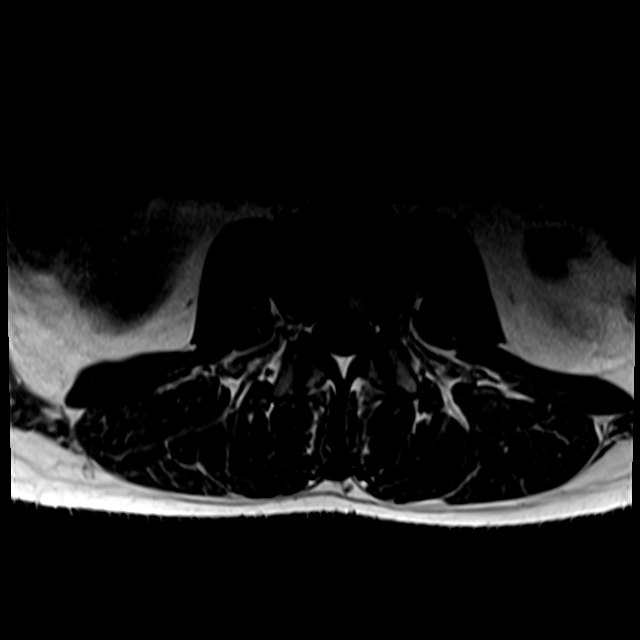
[im 25/38]
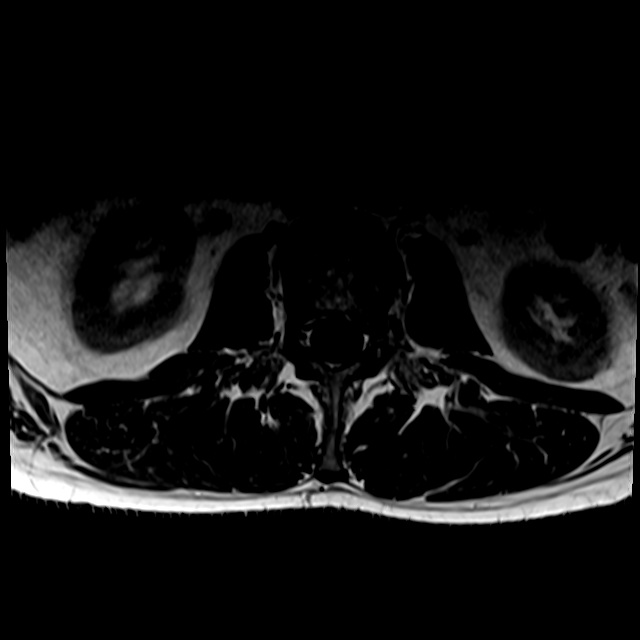
[im 33/38]
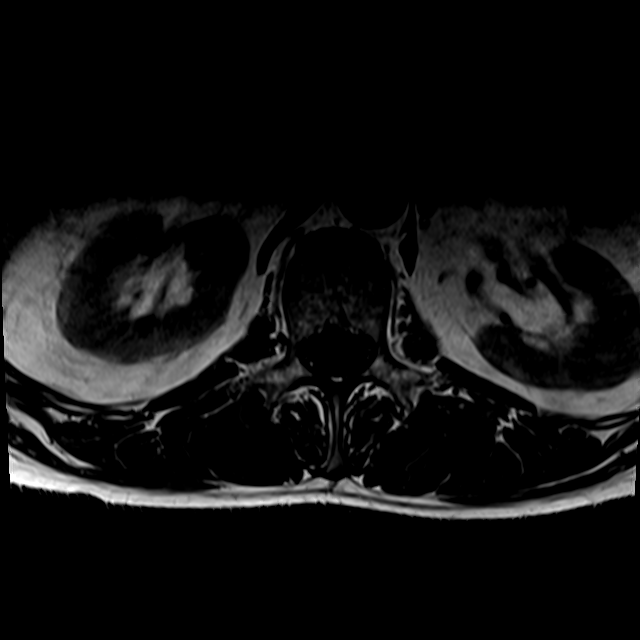

[24 of 48 positions shown; findings below may reference images not displayed]

FINDINGS: Segmentation:  Normal

Alignment:  Normal

Vertebrae:  Negative for fracture, mass, or spinal infection

Conus medullaris and cauda equina: Conus extends to the L1 level.
Conus and cauda equina appear normal.

Paraspinal and other soft tissues: Negative for paraspinous mass,
adenopathy, or soft tissue edema

Disc levels:

L1-2: Negative

L2-3: Negative

L3-4: Negative

L4-5: Mild disc degeneration. Shallow left paracentral disc
protrusion with mild impingement of the left L5 nerve root in the
subarticular zone.

L5-S1: Small to moderate right-sided disc protrusion with right S1
nerve root impingement.
IMPRESSION: Small left paracentral disc protrusion L4-5 with left L5 nerve root
impingement

Small to moderate right-sided disc protrusion L5-S1 with right S1
nerve root impingement.
